# Patient Record
Sex: Female | Born: 1980 | Race: Black or African American | Hispanic: No | Marital: Single | Smoking: Never smoker
Health system: Southern US, Community
[De-identification: ages and names within clinical notes are randomized; demographics above are authoritative.]

---

## 2005-02-19 ENCOUNTER — Emergency Department (HOSPITAL_COMMUNITY): Admission: EM | Admit: 2005-02-19 | Discharge: 2005-02-19 | Payer: Self-pay | Admitting: Emergency Medicine

## 2005-08-24 ENCOUNTER — Emergency Department (HOSPITAL_COMMUNITY): Admission: EM | Admit: 2005-08-24 | Discharge: 2005-08-24 | Payer: Self-pay | Admitting: Emergency Medicine

## 2005-11-06 ENCOUNTER — Inpatient Hospital Stay (HOSPITAL_COMMUNITY): Admission: AD | Admit: 2005-11-06 | Discharge: 2005-11-06 | Payer: Self-pay | Admitting: *Deleted

## 2005-12-14 ENCOUNTER — Inpatient Hospital Stay (HOSPITAL_COMMUNITY): Admission: AD | Admit: 2005-12-14 | Discharge: 2005-12-14 | Payer: Self-pay | Admitting: Obstetrics and Gynecology

## 2005-12-14 ENCOUNTER — Ambulatory Visit: Payer: Self-pay | Admitting: Obstetrics and Gynecology

## 2006-04-13 ENCOUNTER — Inpatient Hospital Stay (HOSPITAL_COMMUNITY): Admission: AD | Admit: 2006-04-13 | Discharge: 2006-04-16 | Payer: Self-pay | Admitting: Obstetrics and Gynecology

## 2006-04-13 ENCOUNTER — Ambulatory Visit: Payer: Self-pay | Admitting: *Deleted

## 2006-04-13 ENCOUNTER — Inpatient Hospital Stay (HOSPITAL_COMMUNITY): Admission: AD | Admit: 2006-04-13 | Discharge: 2006-04-13 | Payer: Self-pay | Admitting: Obstetrics and Gynecology

## 2006-04-13 ENCOUNTER — Inpatient Hospital Stay (HOSPITAL_COMMUNITY): Admission: AD | Admit: 2006-04-13 | Discharge: 2006-04-13 | Payer: Self-pay | Admitting: Gynecology

## 2006-08-12 ENCOUNTER — Emergency Department (HOSPITAL_COMMUNITY): Admission: EM | Admit: 2006-08-12 | Discharge: 2006-08-12 | Payer: Self-pay | Admitting: Emergency Medicine

## 2006-12-22 ENCOUNTER — Emergency Department (HOSPITAL_COMMUNITY): Admission: EM | Admit: 2006-12-22 | Discharge: 2006-12-22 | Payer: Self-pay | Admitting: Emergency Medicine

## 2007-05-30 ENCOUNTER — Emergency Department (HOSPITAL_COMMUNITY): Admission: EM | Admit: 2007-05-30 | Discharge: 2007-05-30 | Payer: Self-pay | Admitting: Emergency Medicine

## 2007-11-14 ENCOUNTER — Emergency Department (HOSPITAL_COMMUNITY): Admission: EM | Admit: 2007-11-14 | Discharge: 2007-11-14 | Payer: Self-pay | Admitting: Family Medicine

## 2008-04-01 ENCOUNTER — Inpatient Hospital Stay (HOSPITAL_COMMUNITY): Admission: AD | Admit: 2008-04-01 | Discharge: 2008-04-01 | Payer: Self-pay | Admitting: Obstetrics & Gynecology

## 2008-04-12 ENCOUNTER — Inpatient Hospital Stay (HOSPITAL_COMMUNITY): Admission: AD | Admit: 2008-04-12 | Discharge: 2008-04-13 | Payer: Self-pay | Admitting: Obstetrics & Gynecology

## 2008-04-19 ENCOUNTER — Inpatient Hospital Stay (HOSPITAL_COMMUNITY): Admission: RE | Admit: 2008-04-19 | Discharge: 2008-04-19 | Payer: Self-pay | Admitting: Obstetrics & Gynecology

## 2008-04-23 ENCOUNTER — Ambulatory Visit (HOSPITAL_COMMUNITY): Admission: RE | Admit: 2008-04-23 | Discharge: 2008-04-23 | Payer: Self-pay | Admitting: Obstetrics & Gynecology

## 2008-04-23 ENCOUNTER — Ambulatory Visit: Payer: Self-pay | Admitting: Obstetrics & Gynecology

## 2008-04-23 ENCOUNTER — Encounter: Payer: Self-pay | Admitting: Obstetrics & Gynecology

## 2009-01-16 ENCOUNTER — Emergency Department (HOSPITAL_COMMUNITY): Admission: EM | Admit: 2009-01-16 | Discharge: 2009-01-16 | Payer: Self-pay | Admitting: Family Medicine

## 2009-02-02 ENCOUNTER — Observation Stay (HOSPITAL_COMMUNITY): Admission: AD | Admit: 2009-02-02 | Discharge: 2009-02-03 | Payer: Self-pay | Admitting: Obstetrics & Gynecology

## 2009-02-02 ENCOUNTER — Ambulatory Visit: Payer: Self-pay | Admitting: Obstetrics & Gynecology

## 2009-02-02 ENCOUNTER — Encounter: Payer: Self-pay | Admitting: Obstetrics & Gynecology

## 2010-09-26 LAB — CBC
HCT: 29.2 % — ABNORMAL LOW (ref 36.0–46.0)
HCT: 32.9 % — ABNORMAL LOW (ref 36.0–46.0)
Hemoglobin: 9.9 g/dL — ABNORMAL LOW (ref 12.0–15.0)
MCHC: 33.7 g/dL (ref 30.0–36.0)
MCV: 82.4 fL (ref 78.0–100.0)
Platelets: 242 10*3/uL (ref 150–400)
RBC: 3.57 MIL/uL — ABNORMAL LOW (ref 3.87–5.11)
RDW: 19.6 % — ABNORMAL HIGH (ref 11.5–15.5)
RDW: 20.2 % — ABNORMAL HIGH (ref 11.5–15.5)

## 2010-09-26 LAB — BASIC METABOLIC PANEL
BUN: 3 mg/dL — ABNORMAL LOW (ref 6–23)
CO2: 24 mEq/L (ref 19–32)
Chloride: 106 mEq/L (ref 96–112)
GFR calc non Af Amer: >60 mL/min (ref >60)
Glucose, Bld: 95 mg/dL (ref 70–99)
Potassium: 2.9 mEq/L — ABNORMAL LOW (ref 3.5–5.1)

## 2010-09-26 LAB — TYPE AND SCREEN: DAT, IgG: NEGATIVE

## 2010-09-27 LAB — POCT INFECTIOUS MONO SCREEN: Mono Screen: NEGATIVE

## 2010-09-27 LAB — POCT RAPID STREP A (OFFICE): Streptococcus, Group A Screen (Direct): NEGATIVE

## 2010-09-27 LAB — POCT PREGNANCY, URINE: Preg Test, Ur: POSITIVE

## 2010-09-29 ENCOUNTER — Ambulatory Visit (INDEPENDENT_AMBULATORY_CARE_PROVIDER_SITE_OTHER): Payer: Self-pay

## 2010-09-29 ENCOUNTER — Inpatient Hospital Stay (INDEPENDENT_AMBULATORY_CARE_PROVIDER_SITE_OTHER)
Admission: RE | Admit: 2010-09-29 | Discharge: 2010-09-29 | Disposition: A | Discharge status: Home / Self Care | Payer: Self-pay | Source: Ambulatory Visit | Attending: Family Medicine | Admitting: Family Medicine

## 2010-09-29 DIAGNOSIS — J3489 Other specified disorders of nose and nasal sinuses: Secondary | ICD-10-CM

## 2010-09-29 DIAGNOSIS — R05 Cough: Secondary | ICD-10-CM

## 2010-11-03 NOTE — H&P (Signed)
Katrina Sanders, Katrina Sanders               ACCOUNT NO.:  [id number]   MEDICAL RECORD NO.:  [id number]          PATIENT TYPE:  AMB   LOCATION:  SDC                           FACILITY:  [facility]   PHYSICIAN:  Allie Bossier, MD        DATE OF BIRTH:  [birth year]   DATE OF ADMISSION:  04/23/2008  DATE OF DISCHARGE:                              HISTORY & PHYSICAL   Katrina Sanders is a 30 year old single black gravida 3, para 1, elective AB 1,  who was seen in the MAU approximately less than a week ago with vaginal  bleeding during pregnancy.  An ultrasound at that point showed a  gestational sac.  Several days later, she went back in with more  bleeding and pain, and the sac had become irregular.  The fetal pole was  measuring 6 weeks, but there was no fetal cardiac activity, and the  products of conception were nearly in the cervix.  The patient was  offered Cytotec then and she declined.  She preferred to have a D&C.  She was given RhoGAM as she was Rh negative.   PAST MEDICAL HISTORY:  None.   PAST SURGICAL HISTORY:  D&C for elective AB.   ALLERGIES:  No known drug allergies.   SOCIAL HISTORY:  She drinks occasionally.  She denies tobacco or illegal  drug use.   FAMILY HISTORY:  Negative for breast, GYN, and colon malignancies.  No  latex allergies.  No known drug allergies.   PHYSICAL EXAMINATION:  VITAL SIGNS:  Stable, afebrile.  HEENT:  Normal.  HEART:  Regular rate and rhythm.  LUNGS:  Clear to auscultation bilaterally.  ABDOMEN:  Benign.  External genitalia, normal.  Cervix is partially  open.  Small-to-moderate amount of vaginal bleeding.  Her uterus is 6  weeks size, mid plane.   ASSESSMENT AND PLAN:  Missed abortion.  The patient declined Cytotec and  wishes to have a D&C.  Risks explained, understood and accepted.      Allie Bossier, MD  Electronically Signed     MCD/MEDQ  D:  04/23/2008  T:  04/23/2008  Job:  [id number]

## 2010-11-03 NOTE — Op Note (Signed)
Katrina Sanders, Katrina Sanders               ACCOUNT NO.:  [id number]   MEDICAL RECORD NO.:  [id number]          PATIENT TYPE:  AMB   LOCATION:  SDC                           FACILITY:  [facility]   PHYSICIAN:  Allie Bossier, MD        DATE OF BIRTH:  1981/03/25   DATE OF PROCEDURE:  02/02/2009  DATE OF DISCHARGE:  02/02/2009                               OPERATIVE REPORT   PREOPERATIVE DIAGNOSES:  Retained products, status post elective  abortion, and endometritis.   POSTOPERATIVE DIAGNOSES:  Retained products, status post elective  abortion, and endometritis.   PROCEDURE:  Suction dilation and curettage   SURGEON:  Allie Bossier, MD   ANESTHESIA:  MAC and local, Quillian Quince, MD   COMPLICATIONS:  None.   ESTIMATED BLOOD LOSS:  Minimal.   SPECIMENS:  Uterine contents.   DETAILS OF PROCEDURE AND FINDINGS:  Risks, benefits, and alternatives of  surgery explained, understood, and accepted.  Consents were signed.  She  was taken to the operating room, placed in dorsal lithotomy position.   MAC anesthesia was applied without complication.  Her vagina was prepped  and draped in the usual sterile fashion.  A bimanual exam revealed a 8-  week size anteverted uterus.  Speculum was placed, a single-tooth  tenaculum was used to grasp the anterior lip of the cervix, and 10 mL of  1% lidocaine were used for a cervical block.  Cervix was already dilated  to accommodate a #8 curved suction curette.  Suction was applied.  A  large amount of uterine contents were removed and a brief amount of  sharp curettage was done to assure complete emptying of the uterus.  The  tenaculum was removed and pressure applied to the tenaculum site yielded  excellent hemostasis.  She was taken to recovery room in stable  condition.  Instruments, sponge, and needle counts were correct.      Allie Bossier, MD  Electronically Signed     MCD/MEDQ  D:  02/02/2009  T:  02/03/2009  Job:  [id number]

## 2010-11-03 NOTE — Discharge Summary (Signed)
NAMEMARGARETT, VITI               ACCOUNT NO.:  [id number]   MEDICAL RECORD NO.:  [id number]          PATIENT TYPE:  INP   LOCATION:  9305                          FACILITY:  [facility]   PHYSICIAN:  Scheryl Darter, MD       DATE OF BIRTH:  08-16-1980   DATE OF ADMISSION:  02/02/2009  DATE OF DISCHARGE:  02/03/2009                               DISCHARGE SUMMARY   DISCHARGE DIAGNOSES:  1. Retained products of conception.  2. Septic abortion.  3. Status post dilation and curettage.   DISCHARGE MEDICATIONS:  1. Motrin 600 mg p.o. q.6 h. p.r.n. pain.  2. Plan B two pills p.o. as directed.  3. Doxycycline 100 mg 1 p.o. q.12 h. x10 days.   CONSULTS:  No consults.   PROCEDURES:  The patient underwent a dilation and curettage for retained  products of conception on February 02, 2009.   LABORATORY DATA:  On admission, the patient's hemoglobin 11.1,  hematocrit 32.9, and white count 6.3.  On discharge, hemoglobin 9.9,  hematocrit 29.2, white count 5.6.   BRIEF HOSPITAL COURSE:  The patient is a 30 year old G3, P 1-0-2-1  coming in status post an abortion done at an outside clinic.  The  patient presented with retained products of conception, severe pain, low-  grade fever of 100.1.  The patient was started on ampicillin,  gentamicin, and clindamycin for septic abortion.  The patient underwent  D and C for retained products of conception.  The patient continued on  ampicillin, gentamicin, and clindamycin for one day of IV antibiotics.  The patient remained afebrile throughout entire hospital course.  The  patient's pain improved and white count remained within normal limits.  The patient was deemed ready for discharge on February 03, 2009.   DISCHARGE INSTRUCTIONS:  The patient is to avoid sexual activity for 6  weeks.  The patient to return for fever greater than 100.4 or pain not  controlled with p.o. medications, other than that no restrictions.   FOLLOWUP APPOINTMENT:  The patient is to  return to her primary care  [facility], where she will receive an IUD.   DISCHARGE CONDITION:  The patient was discharged in stable medical  condition to home.      Eula Fried, MD      ______________________________  Scheryl Darter, MD    DS/MEDQ  D:  02/03/2009  T:  02/03/2009  Job:  [id number]

## 2010-11-03 NOTE — Op Note (Signed)
Katrina Sanders, Katrina Sanders               ACCOUNT NO.:  [id number]   MEDICAL RECORD NO.:  [id number]          PATIENT TYPE:  AMB   LOCATION:  SDC                           [facility]ILITY:  [facility]   PHYSICIAN:  Allie Bossier, MD        DATE OF BIRTH:  02-Oct-1980   DATE OF PROCEDURE:  DATE OF DISCHARGE:                               OPERATIVE REPORT   PREOPERATIVE DIAGNOSIS:  Missed abortion, 6 weeks.   POSTOPERATIVE DIAGNOSIS:  Missed abortion, 6 weeks.   PROCEDURE:  Suction dilation and curettage.   SURGEON:  Allie Bossier, MD   ANESTHESIA:  MAC.   COMPLICATIONS:  None.   ESTIMATED BLOOD LOSS:  Minimal.   SPECIMENS:  Uterine curettings.   DETAILED PROCEDURE AND FINDINGS:  I offered her Cytotec, she declines.  Consents were signed.  She was taken to the operating room and placed in  the dorsal lithotomy position.  Anesthesia was applied.  Vagina was  prepped and draped in usual sterile fashion.  A bimanual exam revealed a  6-week size mid plane uterus.  The adnexa were not enlarged.  Speculum  was placed.  She was placed in gentle Trendelenburg position.  A single-  tooth tenaculum was used to grasp the anterior lip of the cervix.  A  total of 10 mL of 1% lidocaine was used for paracervical block to add  extra postop pain relief.  The cervix was gently dilated, Pratt dilators  to accommodate a #8 for suction curette.  The uterus was emptied and  there was contents.  Sharp curettage of the walls and the fundus of the  uterus ensured complete emptying of the uterine cavity.  The tenaculum  was removed.  Small amount of bleeding was noted from the tenaculum  site.  This was corrected with pressure from ring forceps.  Upon  assurance of hemostasis, she was taken to the recovery room in stable  condition.      Allie Bossier, MD  Electronically Signed     MCD/MEDQ  D:  04/23/2008  T:  04/23/2008  Job:  [id number]

## 2011-03-22 LAB — URINALYSIS, ROUTINE W REFLEX MICROSCOPIC
Bilirubin Urine: NEGATIVE
Hgb urine dipstick: NEGATIVE
Nitrite: NEGATIVE
Protein, ur: NEGATIVE
Urobilinogen, UA: 0.2

## 2011-03-22 LAB — GC/CHLAMYDIA PROBE AMP, GENITAL: GC Probe Amp, Genital: NEGATIVE

## 2011-03-22 LAB — HCG, QUANTITATIVE, PREGNANCY: hCG, Beta Chain, Quant, S: 2144 — ABNORMAL HIGH

## 2011-03-22 LAB — CBC
Hemoglobin: 12.4
Hemoglobin: 13
RBC: 4.06
RBC: 4.28
RDW: 14.2
WBC: 3.7 — ABNORMAL LOW
WBC: 6.1

## 2011-03-22 LAB — RH IMMUNE GLOBULIN WORKUP (NOT WOMEN'S HOSP)
ABO/RH(D): A NEG
Antibody Screen: NEGATIVE

## 2011-03-22 LAB — ABO/RH: ABO/RH(D): A NEG

## 2011-03-22 LAB — WET PREP, GENITAL

## 2011-03-23 LAB — CBC
Hemoglobin: 12
MCHC: 32.8
Platelets: 307
RDW: 14.4

## 2011-03-29 LAB — RAPID STREP SCREEN (MED CTR MEBANE ONLY): Streptococcus, Group A Screen (Direct): NEGATIVE

## 2011-11-21 ENCOUNTER — Encounter (HOSPITAL_COMMUNITY): Payer: Self-pay | Admitting: Emergency Medicine

## 2011-11-21 ENCOUNTER — Emergency Department (HOSPITAL_COMMUNITY)
Admission: EM | Admit: 2011-11-21 | Discharge: 2011-11-21 | Disposition: A | Discharge status: Home / Self Care | Payer: PRIVATE HEALTH INSURANCE | Attending: Emergency Medicine | Admitting: Emergency Medicine

## 2011-11-21 DIAGNOSIS — IMO0001 Reserved for inherently not codable concepts without codable children: Secondary | ICD-10-CM | POA: Insufficient documentation

## 2011-11-21 DIAGNOSIS — R070 Pain in throat: Secondary | ICD-10-CM | POA: Insufficient documentation

## 2011-11-21 DIAGNOSIS — R509 Fever, unspecified: Secondary | ICD-10-CM | POA: Insufficient documentation

## 2011-11-21 DIAGNOSIS — R05 Cough: Secondary | ICD-10-CM | POA: Insufficient documentation

## 2011-11-21 DIAGNOSIS — R059 Cough, unspecified: Secondary | ICD-10-CM | POA: Insufficient documentation

## 2011-11-21 DIAGNOSIS — J069 Acute upper respiratory infection, unspecified: Secondary | ICD-10-CM

## 2011-11-21 MED ORDER — KETOROLAC TROMETHAMINE 60 MG/2ML IM SOLN
60.0000 mg | Freq: Once | INTRAMUSCULAR | Status: AC
  Administered 2011-11-21: 60 mg via INTRAMUSCULAR
  Filled 2011-11-21: qty 2

## 2011-11-21 NOTE — ED Provider Notes (Signed)
History  Scribed for Nat Christen, MD, the patient was seen in room STRE2/STRE2. This chart was scribed by Candelaria Stagers. The patient's care started at 11:19 AM   CSN: [id number]  Arrival date & time 11/21/11  1056   First MD Initiated Contact with Patient 11/21/11 1116      Chief Complaint  Patient presents with  . Sore Throat  . Cough  . Fever  . Generalized Body Aches     HPI Katrina Sanders is a 31 y.o. female who presents to the Emergency Department complaining of sore throat, body aches, and chest pain that started about four days ago starting with the sore throat.  Pt is also experiencing SOB, body chills.  She denies nausea, vomiting, or a productive cough.  She has taken mucinex and advil with little relief.    History reviewed. No pertinent past medical history.  History reviewed. No pertinent past surgical history.  History reviewed. No pertinent family history.  History  Substance Use Topics  . Smoking status: Never Smoker   . Smokeless tobacco: Not on file  . Alcohol Use: Yes    OB History    Grav Para Term Preterm Abortions TAB SAB Ect Mult Living                  Review of Systems  Constitutional:       Body aches    Allergies  Review of patient's allergies indicates no known allergies.  Home Medications  No current outpatient prescriptions on file.  BP 122/84  Pulse 92  Temp(Src) 98.3 F (36.8 C) (Oral)  Resp 16  SpO2 96%  LMP 11/21/2011  Physical Exam  Nursing note and vitals reviewed. Constitutional: She is oriented to person, place, and time. She appears well-developed and well-nourished. No distress.  HENT:  Head: Normocephalic and atraumatic.  Mouth/Throat: No oropharyngeal exudate.       Throat mild erythema, uvula midline.   Eyes: EOM are normal. Pupils are equal, round, and reactive to light.  Neck: Neck supple. No tracheal deviation present.  Cardiovascular: Normal rate.   Pulmonary/Chest: Effort normal. No  respiratory distress. She has no wheezes.       No crackles  Abdominal: Soft. She exhibits no distension.  Musculoskeletal: Normal range of motion. She exhibits no edema.  Lymphadenopathy:    She has no cervical adenopathy.  Neurological: She is alert and oriented to person, place, and time. No sensory deficit.  Skin: Skin is warm and dry.  Psychiatric: She has a normal mood and affect. Her behavior is normal. Judgment and thought content normal.    ED Course  Procedures  DIAGNOSTIC STUDIES: Oxygen Saturation is 96% on room air, normal by my interpretation.    COORDINATION OF CARE:     Labs Reviewed - No data to display No results found.   No diagnosis found.    MDM  Patient with likely viral URI given her constellation of symptoms.  She shows no signs concerning for peritonsillar abscess or strep pharyngitis today.  Her lungs are clear and give no evidence for pneumonia.  I've counseled the patient to continue to use over-the-counter cold medicines and Advil for her symptoms and if she's not improving by the end of the week to return for reevaluation.  Patient will receive a shot of Toradol here to assist with her headache at this time.  I personally performed the services described in this documentation, which was scribed in my presence. The recorded  information has been reviewed and considered.      Nat Christen, MD 11/21/11 1124

## 2011-11-21 NOTE — ED Notes (Signed)
Pt reports onset Wednesday with sore throat. Thursday pt began with coughing, fever, body aches and chest pain.

## 2011-11-21 NOTE — Discharge Instructions (Signed)

## 2011-11-25 ENCOUNTER — Emergency Department (HOSPITAL_COMMUNITY)
Admission: EM | Admit: 2011-11-25 | Discharge: 2011-11-25 | Disposition: A | Discharge status: Home / Self Care | Payer: PRIVATE HEALTH INSURANCE | Attending: Emergency Medicine | Admitting: Emergency Medicine

## 2011-11-25 ENCOUNTER — Encounter (HOSPITAL_COMMUNITY): Payer: Self-pay | Admitting: *Deleted

## 2011-11-25 ENCOUNTER — Emergency Department (HOSPITAL_COMMUNITY): Payer: PRIVATE HEALTH INSURANCE

## 2011-11-25 DIAGNOSIS — J40 Bronchitis, not specified as acute or chronic: Secondary | ICD-10-CM

## 2011-11-25 DIAGNOSIS — R51 Headache: Secondary | ICD-10-CM | POA: Insufficient documentation

## 2011-11-25 DIAGNOSIS — IMO0001 Reserved for inherently not codable concepts without codable children: Secondary | ICD-10-CM | POA: Insufficient documentation

## 2011-11-25 DIAGNOSIS — R05 Cough: Secondary | ICD-10-CM | POA: Insufficient documentation

## 2011-11-25 DIAGNOSIS — R059 Cough, unspecified: Secondary | ICD-10-CM | POA: Insufficient documentation

## 2011-11-25 MED ORDER — HYDROCOD POLST-CHLORPHEN POLST 10-8 MG/5ML PO LQCR: ORAL | Status: DC

## 2011-11-25 MED ORDER — AZITHROMYCIN 250 MG PO TABS: ORAL_TABLET | ORAL | Status: DC

## 2011-11-25 NOTE — ED Provider Notes (Signed)
History   This chart was scribed for Benny Lennert, MD by Charolett Bumpers . The patient was seen in room STRE8/STRE8.    CSN: [id number]  Arrival date & time 11/25/11  1607   First MD Initiated Contact with Patient 11/25/11 1625      Chief Complaint  Patient presents with  . Cough    (Consider location/radiation/quality/duration/timing/severity/associated sxs/prior treatment) HPI Comments: Patient states that she was seen here on Sunday for similar symptoms. Patient states her cough is worsening. Patient states that she had a fever during first couple days of being sick, but has seen subsided. No current fever. Patient denies smoking. Patient states that she has been sick for a total of a week.   Patient is a 31 y.o. female presenting with cough.  Cough This is a new problem. The current episode started more than 1 week ago. The problem occurs every few minutes. The problem has been gradually worsening. The cough is non-productive. There has been no fever. Associated symptoms include headaches and myalgias. Pertinent negatives include no chills and no shortness of breath. She has tried nothing for the symptoms. She is not a smoker.    History reviewed. No pertinent past medical history.  History reviewed. No pertinent past surgical history.  No family history on file.  History  Substance Use Topics  . Smoking status: Never Smoker   . Smokeless tobacco: Not on file  . Alcohol Use: Yes    OB History    Grav Para Term Preterm Abortions TAB SAB Ect Mult Living                  Review of Systems  Constitutional: Negative for fever and chills.  HENT: Positive for congestion.   Respiratory: Positive for cough. Negative for shortness of breath.   Gastrointestinal: Negative for nausea and vomiting.  Musculoskeletal: Positive for myalgias.  Neurological: Positive for headaches. Negative for weakness.  All other systems reviewed and are negative.    Allergies    Review of patient's allergies indicates no known allergies.  Home Medications   Current Outpatient Rx  Name Route Sig Dispense Refill  . GUAIFENESIN ER 600 MG PO TB12 Oral Take 1,200 mg by mouth 2 (two) times daily.    . IBUPROFEN 200 MG PO TABS Oral Take 400 mg by mouth every 6 (six) hours as needed. For pain and fever      BP 116/75  Pulse 88  Temp(Src) 98.4 F (36.9 C) (Oral)  Resp 20  SpO2 98%  LMP 11/21/2011  Physical Exam  Constitutional: She is oriented to person, place, and time. She appears well-developed.  HENT:  Head: Normocephalic and atraumatic.  Eyes: Conjunctivae and EOM are normal. No scleral icterus.  Neck: Neck supple. No thyromegaly present.  Cardiovascular: Normal rate and regular rhythm.  Exam reveals no gallop and no friction rub.   No murmur heard. Pulmonary/Chest: No stridor. She has no wheezes. She has no rales. She exhibits no tenderness.  Abdominal: She exhibits no distension. There is no tenderness. There is no rebound.  Musculoskeletal: Normal range of motion. She exhibits no edema.  Lymphadenopathy:    She has no cervical adenopathy.  Neurological: She is oriented to person, place, and time. Coordination normal.  Skin: No rash noted. No erythema.  Psychiatric: She has a normal mood and affect. Her behavior is normal.    ED Course  Procedures (including critical care time)  DIAGNOSTIC STUDIES: Oxygen Saturation is 98% on room  air, normal by my interpretation.    COORDINATION OF CARE:  1630: Discussed planned course of treatment with the patient, who is agreeable at this time. Will order a chest x-ray.  1727: Recheck: Informed patient of imaging results and planned d/c.    Labs Reviewed - No data to display Dg Chest 2 View  11/25/2011  *RADIOLOGY REPORT*  Clinical Data: Chronic cough.  CHEST - 2 VIEW  Comparison: 09/29/2010.  Findings: Normal sized heart.  Clear lungs.  Normal appearing bones.  IMPRESSION: Normal examination, unchanged.   Original Report Authenticated By: Darrol Angel, M.D.     No diagnosis found.    MDM      The chart was scribed for me under my direct supervision.  I personally performed the history, physical, and medical decision making and all procedures in the evaluation of this patient.Benny Lennert, MD 11/25/11 [phone]

## 2011-11-25 NOTE — ED Notes (Signed)
She was here on Sunday for the same symptoms she has today.  She has a cold coughing headache chest congestion body aches etc

## 2011-11-25 NOTE — Discharge Instructions (Signed)
Follow up if not improving.  Drink plenty of fluids °

## 2012-03-09 ENCOUNTER — Emergency Department (HOSPITAL_COMMUNITY): Payer: No Typology Code available for payment source

## 2012-03-09 ENCOUNTER — Encounter (HOSPITAL_COMMUNITY): Payer: Self-pay | Admitting: Emergency Medicine

## 2012-03-09 ENCOUNTER — Emergency Department (HOSPITAL_COMMUNITY)
Admission: EM | Admit: 2012-03-09 | Discharge: 2012-03-09 | Disposition: A | Discharge status: Home / Self Care | Payer: No Typology Code available for payment source | Attending: Emergency Medicine | Admitting: Emergency Medicine

## 2012-03-09 DIAGNOSIS — K5900 Constipation, unspecified: Secondary | ICD-10-CM

## 2012-03-09 DIAGNOSIS — O2690 Pregnancy related conditions, unspecified, unspecified trimester: Secondary | ICD-10-CM | POA: Insufficient documentation

## 2012-03-09 LAB — CBC
HCT: 36 % (ref 36.0–46.0)
Hemoglobin: 12.6 g/dL (ref 12.0–15.0)
MCH: 29 pg (ref 26.0–34.0)
MCHC: 35 g/dL (ref 30.0–36.0)

## 2012-03-09 LAB — COMPREHENSIVE METABOLIC PANEL
BUN: 6 mg/dL (ref 6–23)
Calcium: 9.6 mg/dL (ref 8.4–10.5)
GFR calc Af Amer: >90 mL/min (ref >90)
Glucose, Bld: 101 mg/dL — ABNORMAL HIGH (ref 70–99)
Sodium: 132 mEq/L — ABNORMAL LOW (ref 135–145)
Total Protein: 7.4 g/dL (ref 6.0–8.3)

## 2012-03-09 LAB — URINALYSIS, ROUTINE W REFLEX MICROSCOPIC
Bilirubin Urine: NEGATIVE
Ketones, ur: NEGATIVE mg/dL
Nitrite: NEGATIVE
pH: 5.5 (ref 5.0–8.0)

## 2012-03-09 LAB — URINE MICROSCOPIC-ADD ON

## 2012-03-09 LAB — POCT PREGNANCY, URINE: Preg Test, Ur: POSITIVE — AB

## 2012-03-09 LAB — WET PREP, GENITAL

## 2012-03-09 LAB — HCG, QUANTITATIVE, PREGNANCY: hCG, Beta Chain, Quant, S: 65167 m[IU]/mL — ABNORMAL HIGH (ref <5)

## 2012-03-09 MED ORDER — BISACODYL 10 MG RE SUPP
10.0000 mg | Freq: Once | RECTAL | Status: DC
  Filled 2012-03-09: qty 1

## 2012-03-09 NOTE — ED Provider Notes (Signed)
History     CSN: [id number]  Arrival date & time 03/09/12  4098   First MD Initiated Contact with Patient 03/09/12 [phone]      Chief Complaint  Patient presents with  . Constipation    (Consider location/radiation/quality/duration/timing/severity/associated sxs/prior treatment) HPI Katrina Sanders is a 31 yo G3P1A1 with LMP unclear, either 7/20 or 7/25 with positive home pregnancy tests and no PMH who presents to the [facility] for evaluation of constipation and lower abdomen cramping with vaginal bleed. The vaginal bleed is described as minimum amount of blood from Thursday last week until Sunday, requiring protection with a panty liner that was moderately soaked with blood. She has no bleeding at this time. The abdominal cramping comes and goes and has been present during this visit.   The constipation has been present for almost 10 days now although she had a small, non-bloody BM of hard stools on Monday after taking an OTC laxative. She has also tried prune juice, stool softener, coffee, and one enema with no BM.   In addition, she had chest discomfort last night and headache but have now resolved. She has not seen an OB/GYN yet for initiation of prenatal care.   Her BP is elevated during this visit. She denies having elevated BP in the past or during her pregnancy with her son.   History reviewed. No pertinent past medical history.  History reviewed. No pertinent past surgical history.  History reviewed. No pertinent family history.  History  Substance Use Topics  . Smoking status: Never Smoker   . Smokeless tobacco: Not on file  . Alcohol Use: Yes    OB History    Grav Para Term Preterm Abortions TAB SAB Ect Mult Living   3 1 1  1  1   1       Review of Systems  Constitutional: Negative for fever, chills, activity change and fatigue.  HENT: Negative for facial swelling.   Respiratory: Negative for shortness of breath.        Chest discomfort last night  Cardiovascular: Positive  for chest pain.       Chest discomfort last night  Gastrointestinal: Positive for nausea, abdominal pain and constipation. Negative for vomiting, diarrhea, blood in stool and abdominal distention.  Genitourinary: Negative for dysuria, hematuria, flank pain, difficulty urinating and pelvic pain.  Skin: Negative for color change and rash.  Neurological: Positive for headaches. Negative for dizziness and light-headedness.  Psychiatric/Behavioral: Negative for behavioral problems and confusion.    Allergies  Review of patient's allergies indicates no known allergies.  Home Medications   No current outpatient prescriptions on file.  BP 144/91  Pulse 69  Temp 97.3 F (36.3 C)  Resp 20  SpO2 100%  LMP 01/19/2012  Physical Exam  Constitutional: She is oriented to person, place, and time. No distress.  Eyes: Conjunctivae normal are normal. Right eye exhibits no discharge. Left eye exhibits no discharge. No scleral icterus.  Cardiovascular: Normal rate, regular rhythm, normal heart sounds and intact distal pulses.   Pulmonary/Chest: Effort normal and breath sounds normal. No respiratory distress. She has no wheezes. She has no rales. She exhibits no tenderness.  Abdominal: Soft. Bowel sounds are normal. She exhibits no distension. There is tenderness. There is no rebound and no guarding.       Lower Quadrant tenderness with L>R  Genitourinary: Vaginal discharge found.       DRE with no stool in stool vault, no internal/external hemorrhoids Leukorrhea of pregnancy, no bleeding  for cervix.    Musculoskeletal: Normal range of motion. She exhibits edema.       1+pitting edema up to her mid shin bilaterally  Neurological: She is alert and oriented to person, place, and time.  Skin: Skin is warm and dry. No rash noted. She is not diaphoretic. No erythema.  Psychiatric: She has a normal mood and affect. Her behavior is normal. Thought content normal.    ED Course  Procedures (including  critical care time)  Labs Reviewed  URINALYSIS, ROUTINE W REFLEX MICROSCOPIC - Abnormal; Notable for the following:    APPearance CLOUDY (*)     Leukocytes, UA TRACE (*)     All other components within normal limits  COMPREHENSIVE METABOLIC PANEL - Abnormal; Notable for the following:    Sodium 132 (*)     Glucose, Bld 101 (*)     Total Bilirubin 0.2 (*)     All other components within normal limits  HCG, QUANTITATIVE, PREGNANCY - Abnormal; Notable for the following:    hCG, Beta Chain, Quant, [place] [address] (*)     All other components within normal limits  POCT PREGNANCY, URINE - Abnormal; Notable for the following:    Preg Test, Ur POSITIVE (*)     All other components within normal limits  URINE MICROSCOPIC-ADD ON - Abnormal; Notable for the following:    Squamous Epithelial / LPF FEW (*)     Bacteria, UA FEW (*)     All other components within normal limits  CBC  WET PREP, GENITAL  GC/CHLAMYDIA PROBE AMP, GENITAL   US Ob Comp Less 14 Wks  03/09/2012  *RADIOLOGY REPORT*  Clinical Data: Abdominal pain.  Spotting.  OBSTETRIC <14 WK [place] AND TRANSVAGINAL OB US  Technique:  Both transabdominal and transvaginal ultrasound examinations were performed for complete evaluation of the gestation as well as the maternal uterus, adnexal regions, and pelvic cul-de-sac.  Transvaginal technique was performed to assess early pregnancy.  Comparison:  None.  Intrauterine gestational sac:  Single.  Normal in shape. Yolk sac: Yes Embryo: Yes Cardiac Activity: Yes Heart Rate: 157 bpm  CRL: 16.1  mm  8 w  0 d             [place] EDC: 10/19/2012  Maternal uterus/adnexae: Small subchorionic hemorrhage.  Right ovary:  Normal.  1.9 x 1.4 x 1.8 cm.  Left ovary:  3.2 x 2.3 x 2.9 cm.  Corpus luteum cyst measuring 1.7 cm.  No free fluid.  IMPRESSION: Single intrauterine pregnancy of approximately 8 weeks 0 days gestation.  Small subchorionic hemorrhage.   Original Report Authenticated By: Gwynn Burly, M.D.    US Ob  Transvaginal  03/09/2012  *RADIOLOGY REPORT*  Clinical Data: Abdominal pain.  Spotting.  OBSTETRIC <14 WK [place] AND TRANSVAGINAL OB US  Technique:  Both transabdominal and transvaginal ultrasound examinations were performed for complete evaluation of the gestation as well as the maternal uterus, adnexal regions, and pelvic cul-de-sac.  Transvaginal technique was performed to assess early pregnancy.  Comparison:  None.  Intrauterine gestational sac:  Single.  Normal in shape. Yolk sac: Yes Embryo: Yes Cardiac Activity: Yes Heart Rate: 157 bpm  CRL: 16.1  mm  8 w  0 d             [place] EDC: 10/19/2012  Maternal uterus/adnexae: Small subchorionic hemorrhage.  Right ovary:  Normal.  1.9 x 1.4 x 1.8 cm.  Left ovary:  3.2 x 2.3 x 2.9 cm.  Corpus  luteum cyst measuring 1.7 cm.  No free fluid.  IMPRESSION: Single intrauterine pregnancy of approximately 8 weeks 0 days gestation.  Small subchorionic hemorrhage.   Original Report Authenticated By: Gwynn Burly, M.D.      No diagnosis found.    MDM   31 yo woman with no PMH or PSH presenting for evaluation of constipation in the setting of first trimester pregnancy with spotting of 4 days duration prior to visit and no prenatal care.   1. Constipation: Pt does not have hx of abdominal surgery making concern for adhesion less likely. Her BS are present and normal, she has no guarding on abdominal tenderness making obstruction, SBP less likely. This certainly could be due to increased progesterone secondary to her pregnancy. Rectal exam with no stool impaction.  -Dulcolax 10mg  suppository once  2. Vaginal Spotting. This was concerning in the context of abdominal cramping, first trimester pregnancy, and history of miscarriage. She has had no OB evaluation at this point.  -Transvaginal US showing single uterine pregnancy of ~8weeks with subchorionic hemorrhage c/w implantation. Results discussed with patient.  -Pelvic exam showing no blood surrounding  cervix -GC/Chlamydia pending -CBC pending  3. Elevated BP. This could be preexisting but could also be related to pregnancy such as in rare instance of molar pregnancy, could also be related to coffee use last night. BP is trending down, will continue to monitor during this visit.  -UA with no proteinuria -serum HCG, quantitative c/w 8 week pregnancy  4. Pregnancy. Pt no sure of LMP, with no prenatal care at this time. Reports having a SVB for her 60-year-old son with no complications during that pregnancy. Her partner is aware, she has social support and feels safe at home. Will encourage pt to seek prenatal care ASAP.  -8wk single uterine pregnancy per above.  -Pt to call OB provider in her medical insurance network  Dispo: Pt discharged home with instructions to follow up with OB/GYN        Ky Barban, MD 03/09/12 [phone]

## 2012-03-09 NOTE — ED Notes (Signed)
Pt states that she hasn't had a bowel movement in two weeks. She recently found out she was pregnant.She took over the counter meds and no relief.

## 2012-03-09 NOTE — ED Notes (Signed)
Pt returned from u/s

## 2012-03-09 NOTE — ED Notes (Signed)
Pt attempting to obtain urine specimen 

## 2012-03-10 LAB — URINE CULTURE
Colony Count: NO GROWTH
Culture: NO GROWTH

## 2012-03-10 LAB — GC/CHLAMYDIA PROBE AMP, GENITAL: Chlamydia, DNA Probe: NEGATIVE

## 2012-03-13 NOTE — ED Provider Notes (Signed)
I saw and evaluated the patient, reviewed the resident's note and I agree with the findings and plan. Pt c/o recent constipation. No abd distension. No nv. Also pos preg test. abd soft nt. Labs/us.   Suzi Roots, MD 03/13/12 [phone]

## 2012-04-20 ENCOUNTER — Emergency Department (HOSPITAL_COMMUNITY)
Admission: EM | Admit: 2012-04-20 | Discharge: 2012-04-20 | Disposition: A | Discharge status: Home / Self Care | Payer: PRIVATE HEALTH INSURANCE | Source: Home / Self Care | Attending: Emergency Medicine | Admitting: Emergency Medicine

## 2012-04-20 ENCOUNTER — Encounter (HOSPITAL_COMMUNITY): Payer: Self-pay | Admitting: Emergency Medicine

## 2012-04-20 DIAGNOSIS — J209 Acute bronchitis, unspecified: Secondary | ICD-10-CM

## 2012-04-20 MED ORDER — FLUCONAZOLE 150 MG PO TABS: 150.0000 mg | ORAL_TABLET | Freq: Once | ORAL | Status: AC

## 2012-04-20 MED ORDER — PREDNISONE 5 MG PO KIT: 1.0000 | PACK | Freq: Every day | ORAL | Status: AC

## 2012-04-20 MED ORDER — ALBUTEROL SULFATE HFA 108 (90 BASE) MCG/ACT IN AERS: 1.0000 | INHALATION_SPRAY | Freq: Four times a day (QID) | RESPIRATORY_TRACT | Status: AC | PRN

## 2012-04-20 MED ORDER — AZITHROMYCIN 250 MG PO TABS: ORAL_TABLET | ORAL | Status: AC

## 2012-04-20 MED ORDER — GUAIFENESIN-CODEINE 100-10 MG/5ML PO SYRP: 10.0000 mL | ORAL_SOLUTION | Freq: Four times a day (QID) | ORAL | Status: AC | PRN

## 2012-04-20 NOTE — ED Notes (Signed)
When questioned about LMP, pt said  she has been bleeding for a month, that she had gotten a Mirena IUD inserted.

## 2012-04-20 NOTE — ED Notes (Signed)
Pt has had cough/cold x 1 week. Greenish/yellow sputum. States chest is sore from coughing.

## 2012-04-20 NOTE — Discharge Instructions (Signed)

## 2012-04-20 NOTE — ED Provider Notes (Signed)
Chief Complaint  Patient presents with  . Cough    History of Present Illness:   The patient is a 31 year old female who has had a one-week history of a croupy cough productive of small amounts of thick, green sputum, aching in her chest when she coughs but no wheezing or chest tightness. She also has had sore throat, nasal congestion with yellow drainage, headache, sinus pressure, ear congestion, and she had some fever and chills at the onset of her symptoms. She occasionally has some posttussive vomiting. She has not been exposed to anyone with flu or pertussis.  Review of Systems:  Other than noted above, the patient denies any of the following symptoms. Systemic:  No fever, chills, sweats, fatigue, myalgias, headache, or anorexia. Eye:  No redness, pain or drainage. ENT:  No earache, ear congestion, nasal congestion, sneezing, rhinorrhea, sinus pressure, sinus pain, post nasal drip, or sore throat. Lungs:  No cough, sputum production, wheezing, shortness of breath, or chest pain. GI:  No abdominal pain, nausea, vomiting, or diarrhea.  PMFSH:  Past medical history, family history, social history, meds, and allergies were reviewed.  Physical Exam:   Vital signs:  BP 133/90  Pulse 80  Temp 97.6 F (36.4 C) (Oral)  Resp 18  SpO2 98%  LMP 03/21/2012  Breastfeeding? Unknown General:  Alert, in no distress. Eye:  No conjunctival injection or drainage. Lids were normal. ENT:  TMs and canals were normal, without erythema or inflammation.  Nasal mucosa was clear and uncongested, without drainage.  Mucous membranes were moist.  Pharynx was mildly erythematous with slight cobblestoning.  There were no oral ulcerations or lesions. Neck:  Supple, no adenopathy, tenderness or mass. Lungs:  No respiratory distress.  Lungs were clear to auscultation, without wheezes, rales or rhonchi.  Breath sounds were clear and equal bilaterally.  Heart:  Regular rhythm, without gallops, murmers or rubs. Skin:   Clear, warm, and dry, without rash or lesions.  Assessment:  The encounter diagnosis was Acute bronchitis.  Plan:   1.  The following meds were prescribed:   New Prescriptions   ALBUTEROL (PROVENTIL HFA;VENTOLIN HFA) 108 (90 BASE) MCG/ACT INHALER    Inhale 1-2 puffs into the lungs every 6 (six) hours as needed for wheezing.   AZITHROMYCIN (ZITHROMAX Z-PAK) 250 MG TABLET    Take as directed.   FLUCONAZOLE (DIFLUCAN) 150 MG TABLET    Take 1 tablet (150 mg total) by mouth once.   GUAIFENESIN-CODEINE (GUIATUSS AC) 100-10 MG/5ML SYRUP    Take 10 mLs by mouth 4 (four) times daily as needed for cough.   PREDNISONE 5 MG KIT    Take 1 kit (5 mg total) by mouth daily after breakfast. Prednisone 5 mg 6 day dosepack.  Take as directed.   2.  The patient was instructed in symptomatic care and handouts were given. 3.  The patient was told to return if becoming worse in any way, if no better in 3 or 4 days, and given some red flag symptoms that would indicate earlier return.   Reuben Likes, MD 04/20/12 [phone]

## 2014-04-22 ENCOUNTER — Encounter (HOSPITAL_COMMUNITY): Payer: Self-pay | Admitting: Emergency Medicine

## 2014-06-06 IMAGING — CR DG CHEST 2V
2 series · 2 of 2 positions shown · non-contrast
Comparison: 09/29/2010.

CLINICAL DATA: Chronic cough.

CHEST - 2 VIEW

[w chest pa]
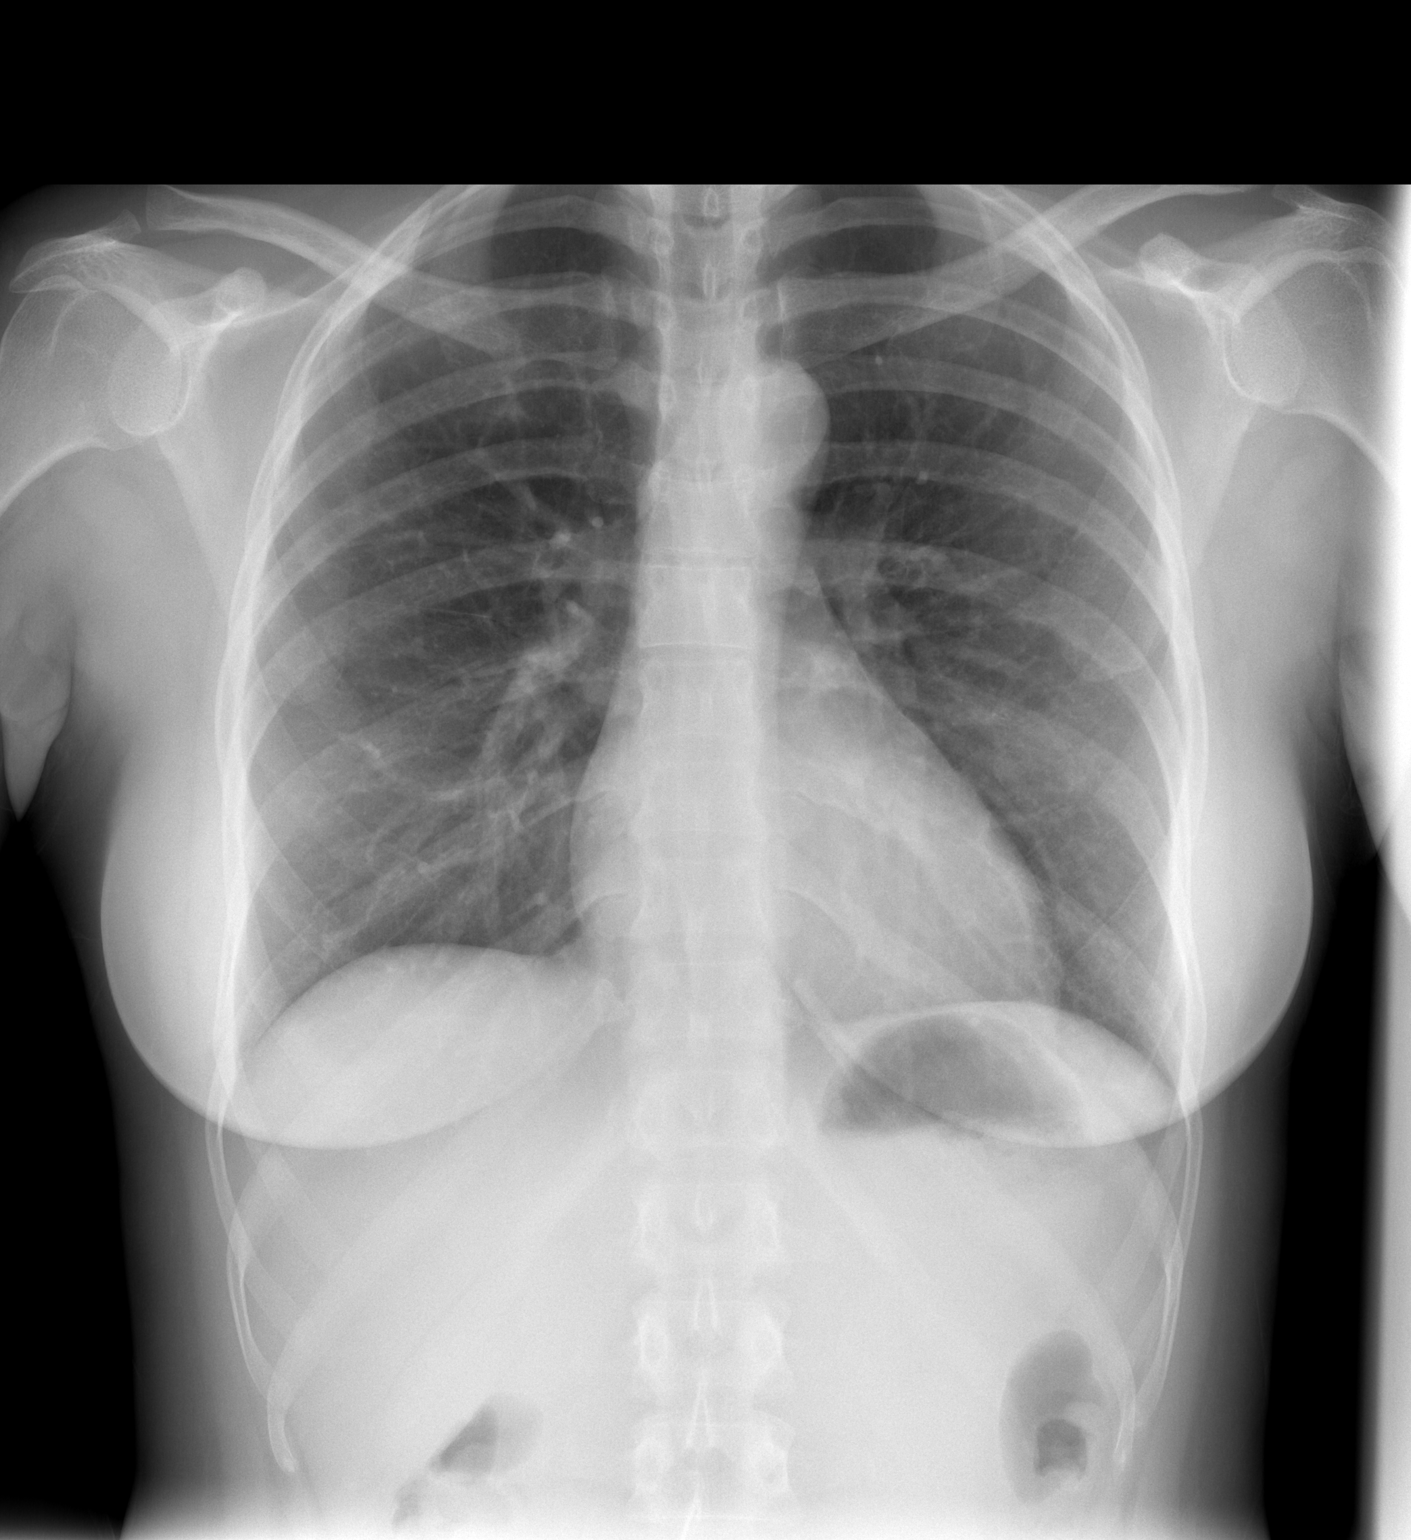

[w chest lat]
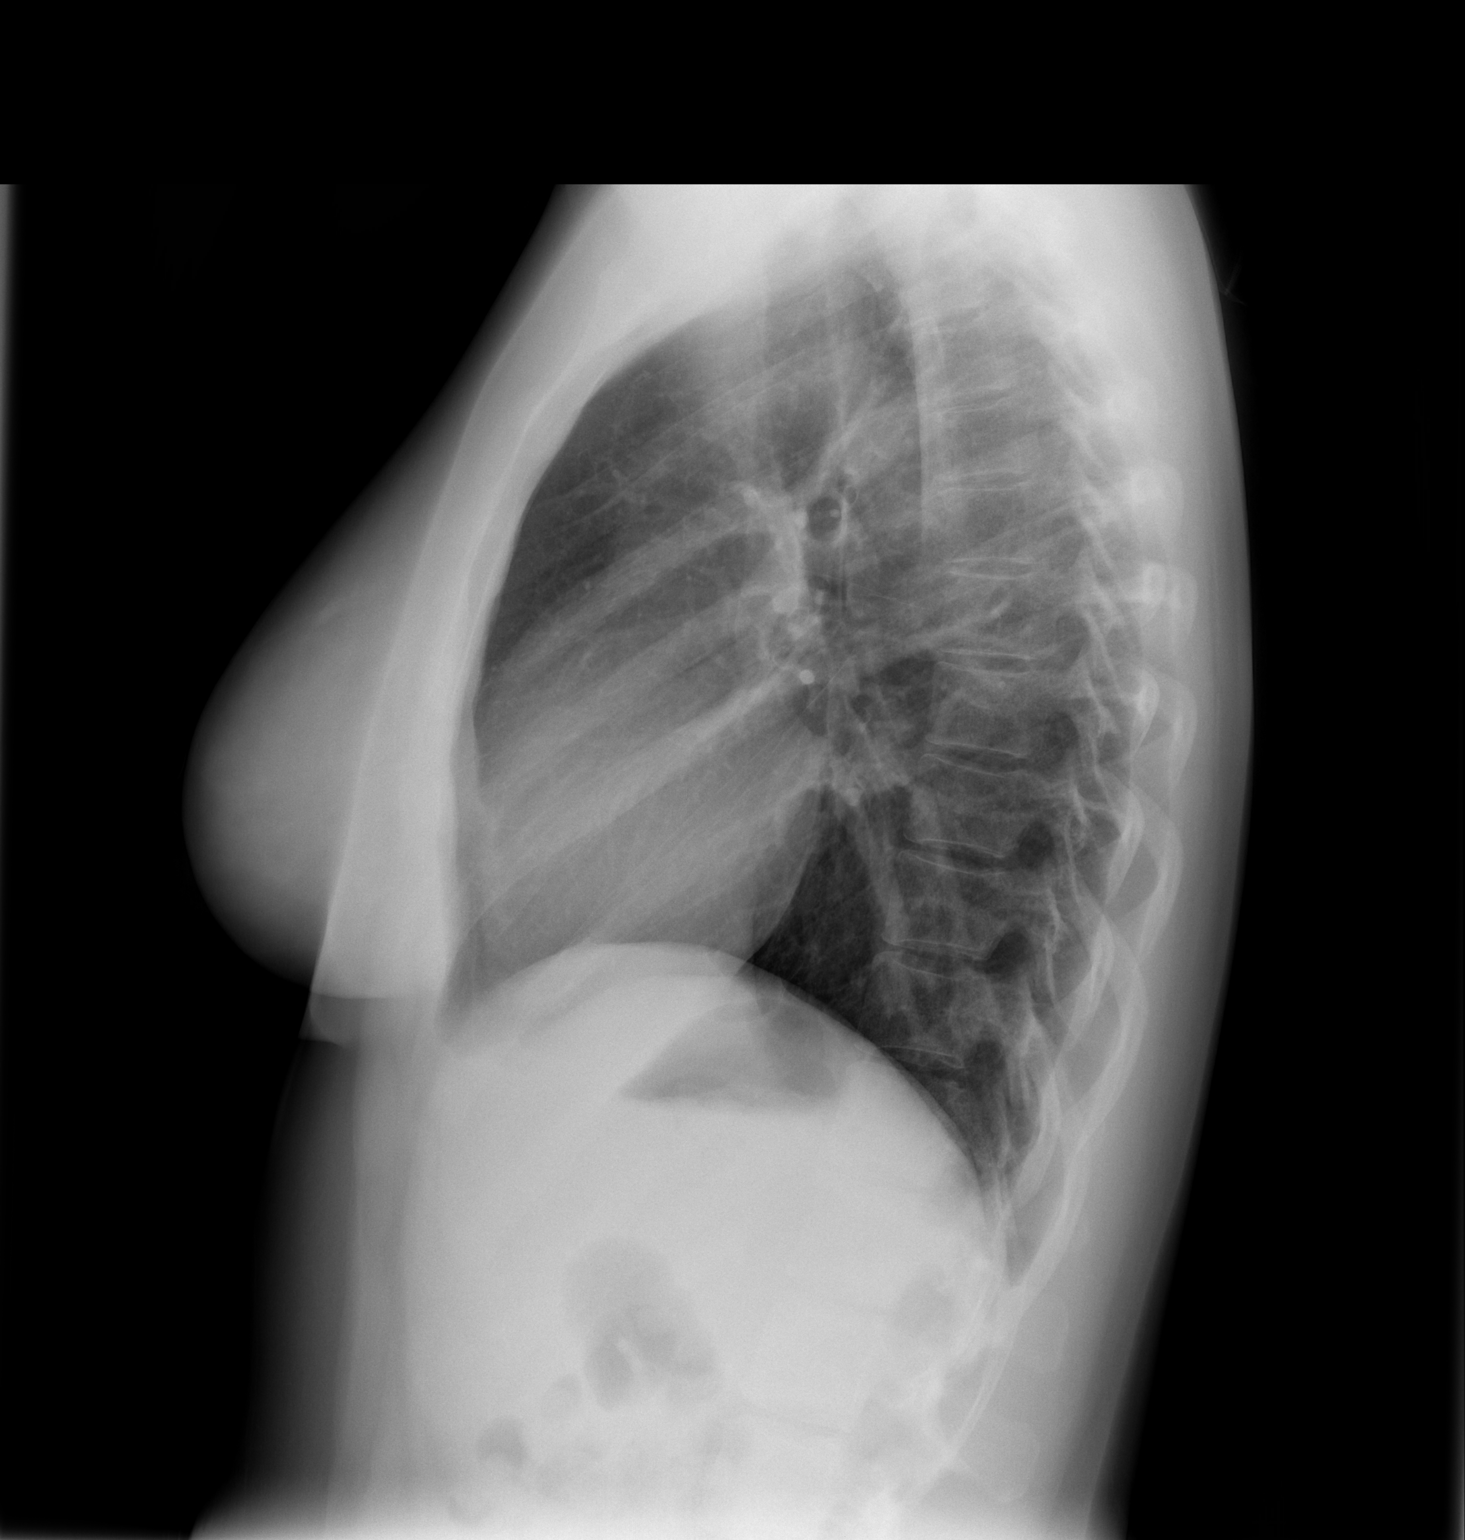

[2 of 2 positions shown; findings below may reference images not displayed]

FINDINGS: Normal sized heart.  Clear lungs.  Normal appearing
bones.
IMPRESSION: Normal examination, unchanged.

## 2017-02-23 ENCOUNTER — Ambulatory Visit: Payer: PRIVATE HEALTH INSURANCE | Admitting: Family Medicine

## 2017-04-08 ENCOUNTER — Telehealth: Payer: Self-pay

## 2017-04-08 NOTE — Telephone Encounter (Signed)
Patient called to establish care and have her birth control contraceptive taken out that was put in by a provider in [place]. I placed the call on hold to ask providers on how to proceed and confirmed with Dr. Jimmey RalphParker that he would be happy to take it out for her and have her establish. Patient was no longer on the phone.   If patient calls back to establish, schedule with Dr. Jimmey RalphParker and make the visit 1 hour so that he can do the procedure the same day and establish care.

## 2017-12-09 ENCOUNTER — Encounter (HOSPITAL_COMMUNITY): Payer: Self-pay

## 2017-12-09 ENCOUNTER — Emergency Department (HOSPITAL_COMMUNITY)
Admission: EM | Admit: 2017-12-09 | Discharge: 2017-12-09 | Disposition: A | Discharge status: Home / Self Care | Payer: Medicaid Other | Attending: Emergency Medicine | Admitting: Emergency Medicine

## 2017-12-09 ENCOUNTER — Other Ambulatory Visit: Payer: Self-pay

## 2017-12-09 DIAGNOSIS — J302 Other seasonal allergic rhinitis: Secondary | ICD-10-CM | POA: Insufficient documentation

## 2017-12-09 DIAGNOSIS — J309 Allergic rhinitis, unspecified: Secondary | ICD-10-CM

## 2017-12-09 DIAGNOSIS — Z79899 Other long term (current) drug therapy: Secondary | ICD-10-CM | POA: Insufficient documentation

## 2017-12-09 DIAGNOSIS — R0981 Nasal congestion: Secondary | ICD-10-CM | POA: Diagnosis present

## 2017-12-09 MED ORDER — GUAIFENESIN ER 1200 MG PO TB12: 1.0000 | ORAL_TABLET | Freq: Two times a day (BID) | ORAL | 1 refills | Status: AC | PRN

## 2017-12-09 MED ORDER — FLUTICASONE PROPIONATE 50 MCG/ACT NA SUSP: 2.0000 | Freq: Every day | NASAL | 0 refills | Status: AC

## 2017-12-09 MED ORDER — CETIRIZINE HCL 10 MG PO CAPS: 10.0000 mg | ORAL_CAPSULE | Freq: Every day | ORAL | 0 refills | Status: AC

## 2017-12-09 NOTE — Discharge Instructions (Signed)
Please take medication as prescribed.  Follow attached handout.  Follow up with your primary care doctor this week.  If you develop worsening or new concerning symptoms you can return to the emergency department for re-evaluation.

## 2017-12-09 NOTE — ED Notes (Signed)
Patient verbalized understanding of discharge instructions and denies any further needs or questions at this time. VS stable. Patient ambulatory with steady gait.  

## 2017-12-09 NOTE — ED Provider Notes (Signed)
[facility] EMERGENCY DEPARTMENT Provider Note   CSN: [id number] Arrival date & time: 12/09/17  1147     History   Chief Complaint Chief Complaint  Patient presents with  . Nasal Congestion    HPI Katrina Sanders is a 37 y.o. female no significant past medical history presents emergency department today for itchy/watery eyes, nasal congestion, rhinorrhea, mild swelling underneath the eyes and sneezing.  Patient reports that she was outside at a festival 2 days ago when she started noticing her symptoms.  She feels like her eyes became more swollen as well as itchy and watery.  She reports increased sneezing as well as nasal drainage.  She is taken Benadryl for symptoms with moderate relief but notes it makes her tired.  She denies history of allergies.  She is presenting today for evaluation of this.  She knows nothing makes her symptoms worse.  She denies any fever, chills, headache, visual changes, eye pain, pain with extra ocular movements, eye redness, eye crusting, jaw claudication, ear pain/fullness, tinnitus, ear drainage, epistaxis, sore throat, neck stiffness, rash, cough, rash, shortness of breath, abdominal cramping, nausea or vomiting.  She denies any new changes to soaps/lotions/detergents.  No new animal or plant contacts.  She denies any insect bites.  No other complaints at this time.  HPI  History reviewed. No pertinent past medical history.  There are no active problems to display for this patient.   History reviewed. No pertinent surgical history.   OB History    Gravida  3   Para  1   Term  1   Preterm      AB  1   Living  1     SAB  1   TAB      Ectopic      Multiple      Live Births               Home Medications    Prior to Admission medications   Medication Sig Start Date End Date Taking? Authorizing Provider  albuterol (PROVENTIL HFA;VENTOLIN HFA) 108 (90 BASE) MCG/ACT inhaler Inhale 1-2 puffs into the lungs  every 6 (six) hours as needed for wheezing. 04/20/12   Harden Mo, MD  azithromycin (ZITHROMAX Z-PAK) 250 MG tablet Take as directed. 04/20/12   Harden Mo, MD  fluconazole (DIFLUCAN) 150 MG tablet Take 1 tablet (150 mg total) by mouth once. 04/20/12   Harden Mo, MD  guaiFENesin-codeine (GUIATUSS AC) 100-10 MG/5ML syrup Take 10 mLs by mouth 4 (four) times daily as needed for cough. 04/20/12   Harden Mo, MD  PredniSONE 5 MG KIT Take 1 kit (5 mg total) by mouth daily after breakfast. Prednisone 5 mg 6 day dosepack.  Take as directed. 04/20/12   Harden Mo, MD    Family History History reviewed. No pertinent family history.  Social History Social History   Tobacco Use  . Smoking status: Never Smoker  Substance Use Topics  . Alcohol use: Yes  . Drug use: No     Allergies   Patient has no known allergies.   Review of Systems Review of Systems  All other systems reviewed and are negative.    Physical Exam Updated Vital Signs BP (!) 134/99 (BP Location: Right Arm)   Pulse 80   Temp 98.3 F (36.8 C) (Oral)   Resp 16   Ht 5' 6"  (1.676 m)   Wt 77.1 kg (170 lb)  LMP 11/29/2017 (Approximate)   SpO2 100%   BMI 27.44 kg/m   Physical Exam  Constitutional: She appears well-developed and well-nourished.  HENT:  Head: Normocephalic and atraumatic.  Right Ear: Tympanic membrane, external ear and ear canal normal.  Left Ear: Tympanic membrane, external ear and ear canal normal.  Nose: Mucosal edema and rhinorrhea present. Right sinus exhibits no maxillary sinus tenderness and no frontal sinus tenderness. Left sinus exhibits no maxillary sinus tenderness and no frontal sinus tenderness.  Mouth/Throat: Uvula is midline, oropharynx is clear and moist and mucous membranes are normal. No tonsillar exudate.  The patient has normal phonation and is in control of secretions. No stridor.  Midline uvula without edema. Soft palate rises symmetrically.  No tonsillar  erythema or exudates. No PTA. Tongue protrusion is normal. No trismus. No creptius on neck palpation and patient has good dentition. No gingival erythema or fluctuance noted. Mucus membranes moist.   Eyes: Pupils are equal, round, and reactive to light. Right eye exhibits no discharge. Left eye exhibits no discharge. No scleral icterus.   No periorbital swelling, erythema or edema.  Normal extra ocular movements without pain or entrapment.  Conjunctiva and sclera clear without erythema, injection.  No scleral icterus.  No drainage.  No crusting on eyelashes.  Neck: Trachea normal. Neck supple. No spinous process tenderness present. No neck rigidity. Normal range of motion present.  No nuchal rigidity or meningismus  Cardiovascular: Normal rate, regular rhythm and intact distal pulses.  No murmur heard. Pulses:      Radial pulses are 2+ on the right side, and 2+ on the left side.       Dorsalis pedis pulses are 2+ on the right side, and 2+ on the left side.       Posterior tibial pulses are 2+ on the right side, and 2+ on the left side.  No lower extremity swelling or edema. Calves symmetric in size bilaterally.  Pulmonary/Chest: Effort normal and breath sounds normal. She exhibits no tenderness.  No increased work of breathing. No accessory muscle use. Patient is sitting upright, speaking in full sentences without difficulty   Abdominal: Soft. Bowel sounds are normal. There is no tenderness. There is no rebound and no guarding.  Musculoskeletal: She exhibits no edema.  Lymphadenopathy:    She has no cervical adenopathy.  Neurological: She is alert.  Skin: Skin is warm and dry. No rash noted. She is not diaphoretic.  No rash.   Psychiatric: She has a normal mood and affect.  Nursing note and vitals reviewed.    ED Treatments / Results  Labs (all labs ordered are listed, but only abnormal results are displayed) Labs Reviewed - No data to display  EKG None  Radiology No results  found.  Procedures Procedures (including critical care time)  Medications Ordered in ED Medications - No data to display   Initial Impression / Assessment and Plan / ED Course  I have reviewed the triage vital signs and the nursing notes.  Pertinent labs & imaging results that were available during my care of the patient were reviewed by me and considered in my medical decision making (see chart for details).     37 year old female presenting with itchy/watery eyes, nasal congestion, rhinorrhea, sneezing & mild swelling underneath the eyes.  Exam is consistent with allergic rhinitis.  Will prescribe patient Zyrtec and Flonase.  Exam is not concerning for orbital cellulitis or anaphylaxis.  No meningeal signs.  She denies any eye redness  or eye pain.  Vital signs are reassuring.  Patient be discharged home on Zyrtec and Flonase with follow-up with PCP. I advised the patient to follow-up with PCP this week. Specific return precautions discussed. Time was given for all questions to be answered. The patient verbalized understanding and agreement with plan. The patient appears safe for discharge home.  Final Clinical Impressions(s) / ED Diagnoses   Final diagnoses:  Allergic rhinitis, unspecified seasonality, unspecified trigger    ED Discharge Orders        Ordered    fluticasone (FLONASE) 50 MCG/ACT nasal spray  Daily     12/09/17 1240    Cetirizine HCl (ZYRTEC ALLERGY) 10 MG CAPS  Daily     12/09/17 1240    Guaifenesin (MUCINEX MAXIMUM STRENGTH) 1200 MG TB12  Every 12 hours PRN     12/09/17 1240       Lorelle Gibbs 12/10/17 0824    Nat Christen, MD 12/12/17 2047

## 2017-12-09 NOTE — ED Triage Notes (Signed)
Pt complains of eye irritation and congestion x 2 days. Has been using cool compresses over eyes with relief. VSS.

## 2017-12-09 NOTE — ED Notes (Signed)
EDP at bedside
